# Patient Record
Sex: Male | Born: 1995 | Race: White | Hispanic: No | Marital: Single
Health system: Southern US, Community
[De-identification: ages and names within clinical notes are randomized; demographics above are authoritative.]

## PROBLEM LIST (undated history)

## (undated) HISTORY — PX: ANKLE SURGERY: SHX546

---
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
## 2017-02-06 ENCOUNTER — Other Ambulatory Visit: Payer: Self-pay

## 2017-02-06 ENCOUNTER — Ambulatory Visit (INDEPENDENT_AMBULATORY_CARE_PROVIDER_SITE_OTHER): Payer: 59

## 2017-02-06 ENCOUNTER — Ambulatory Visit (HOSPITAL_COMMUNITY)
Admission: EM | Admit: 2017-02-06 | Discharge: 2017-02-06 | Disposition: A | Discharge status: Home / Self Care | Payer: 59 | Attending: Family Medicine | Admitting: Family Medicine

## 2017-02-06 ENCOUNTER — Encounter (HOSPITAL_COMMUNITY): Payer: Self-pay | Admitting: Emergency Medicine

## 2017-02-06 DIAGNOSIS — R05 Cough: Secondary | ICD-10-CM

## 2017-02-06 DIAGNOSIS — R0789 Other chest pain: Secondary | ICD-10-CM | POA: Diagnosis not present

## 2017-02-06 DIAGNOSIS — R059 Cough, unspecified: Secondary | ICD-10-CM

## 2017-02-06 MED ORDER — AZITHROMYCIN 250 MG PO TABS: 250.0000 mg | ORAL_TABLET | Freq: Every day | ORAL | 0 refills | Status: AC

## 2017-02-06 NOTE — ED Provider Notes (Signed)
  [facility]   [id number] 02/06/17 Arrival Time: 1009  ASSESSMENT & PLAN:  1. Cough   -persistent  Meds ordered this encounter  Medications  . azithromycin (ZITHROMAX) 250 MG tablet    Sig: Take 1 tablet (250 mg total) by mouth daily. Take first 2 tablets together, then 1 every day until finished.    Dispense:  6 tablet    Refill:  0   OTC symptom care as needed. May f/u with PCP or here as needed.  Reviewed expectations re: course of current medical issues. Questions answered. Outlined signs and symptoms indicating need for more acute intervention. Patient verbalized understanding. After Visit Summary given.   SUBJECTIVE: History from: patient and caregiver.  Wyatt Ryan is a 22 y.o. male who presents with complaint of an intermittent dry cough. Onset abrupt, approximately approx 2 months ago. SOB: none. Wheezing: none. Fever: no. Overall normal PO intake without n/v. Sick contacts: no. OTC treatment: none. [employer] student. Otherwise feeling well. For the past 4-5 days he reports lower R anterior chest discomfort, esp when coughing or taking a deep breath. No injury.  Social History   Tobacco Use  Smoking Status Not on file    ROS: As per HPI.   OBJECTIVE:  Vitals:   02/06/17 1102  BP: 127/66  Pulse: 77  Resp: 18  Temp: 98.3 F (36.8 C)  TempSrc: Oral  SpO2: 98%    General appearance: alert HEENT: mild nasal congestion; throat normal Neck: supple without LAD Lungs: unlabored respirations, symmetrical air entry; cough: mild; no respiratory distress Skin: warm and dry Psychological: alert and cooperative; normal mood and affect  Imaging: Dg Chest 2 View  Result Date: 02/06/2017 CLINICAL DATA:  URI symptoms since before Thanksgiving, intermittently productive cough, sharp ribcage discomfort EXAM: CHEST  2 VIEW COMPARISON:  None in PACs FINDINGS: The lungs are well-expanded and clear. The heart and pulmonary vascularity are normal. The  mediastinum is normal in width. There is no pleural effusion. The bony thorax exhibits no acute abnormality. IMPRESSION: There is no pneumonia nor other acute cardiopulmonary abnormality. Electronically Signed   By: David  SwazilandJordan M.D.   On: 02/06/2017 11:48    No Known Allergies  Social History   Socioeconomic History  . Marital status: Single    Spouse name: Not on file  . Number of children: Not on file  . Years of education: Not on file  . Highest education level: Not on file  Social Needs  . Financial resource strain: Not on file  . Food insecurity - worry: Not on file  . Food insecurity - inability: Not on file  . Transportation needs - medical: Not on file  . Transportation needs - non-medical: Not on file  Occupational History  . Not on file  Tobacco Use  . Smoking status: Not on file  Substance and Sexual Activity  . Alcohol use: Not on file  . Drug use: Not on file  . Sexual activity: Not on file  Other Topics Concern  . Not on file  Social History Narrative  . Not on file           Mardella LaymanHagler, Roanne Haye, MD 02/06/17 1204

## 2017-02-06 NOTE — ED Triage Notes (Signed)
Pt c/o Right lower rib pain x5 days, has had a cold symptoms since before thanksgiving. Pt c/o coughing.

## 2019-04-26 IMAGING — DX DG CHEST 2V
2 series · 2 of 2 positions shown · non-contrast
Comparison: None in PACs

CLINICAL DATA: URI symptoms since before Thanksgiving,
intermittently productive cough, sharp ribcage discomfort

EXAM:
CHEST  2 VIEW

[chest pa]
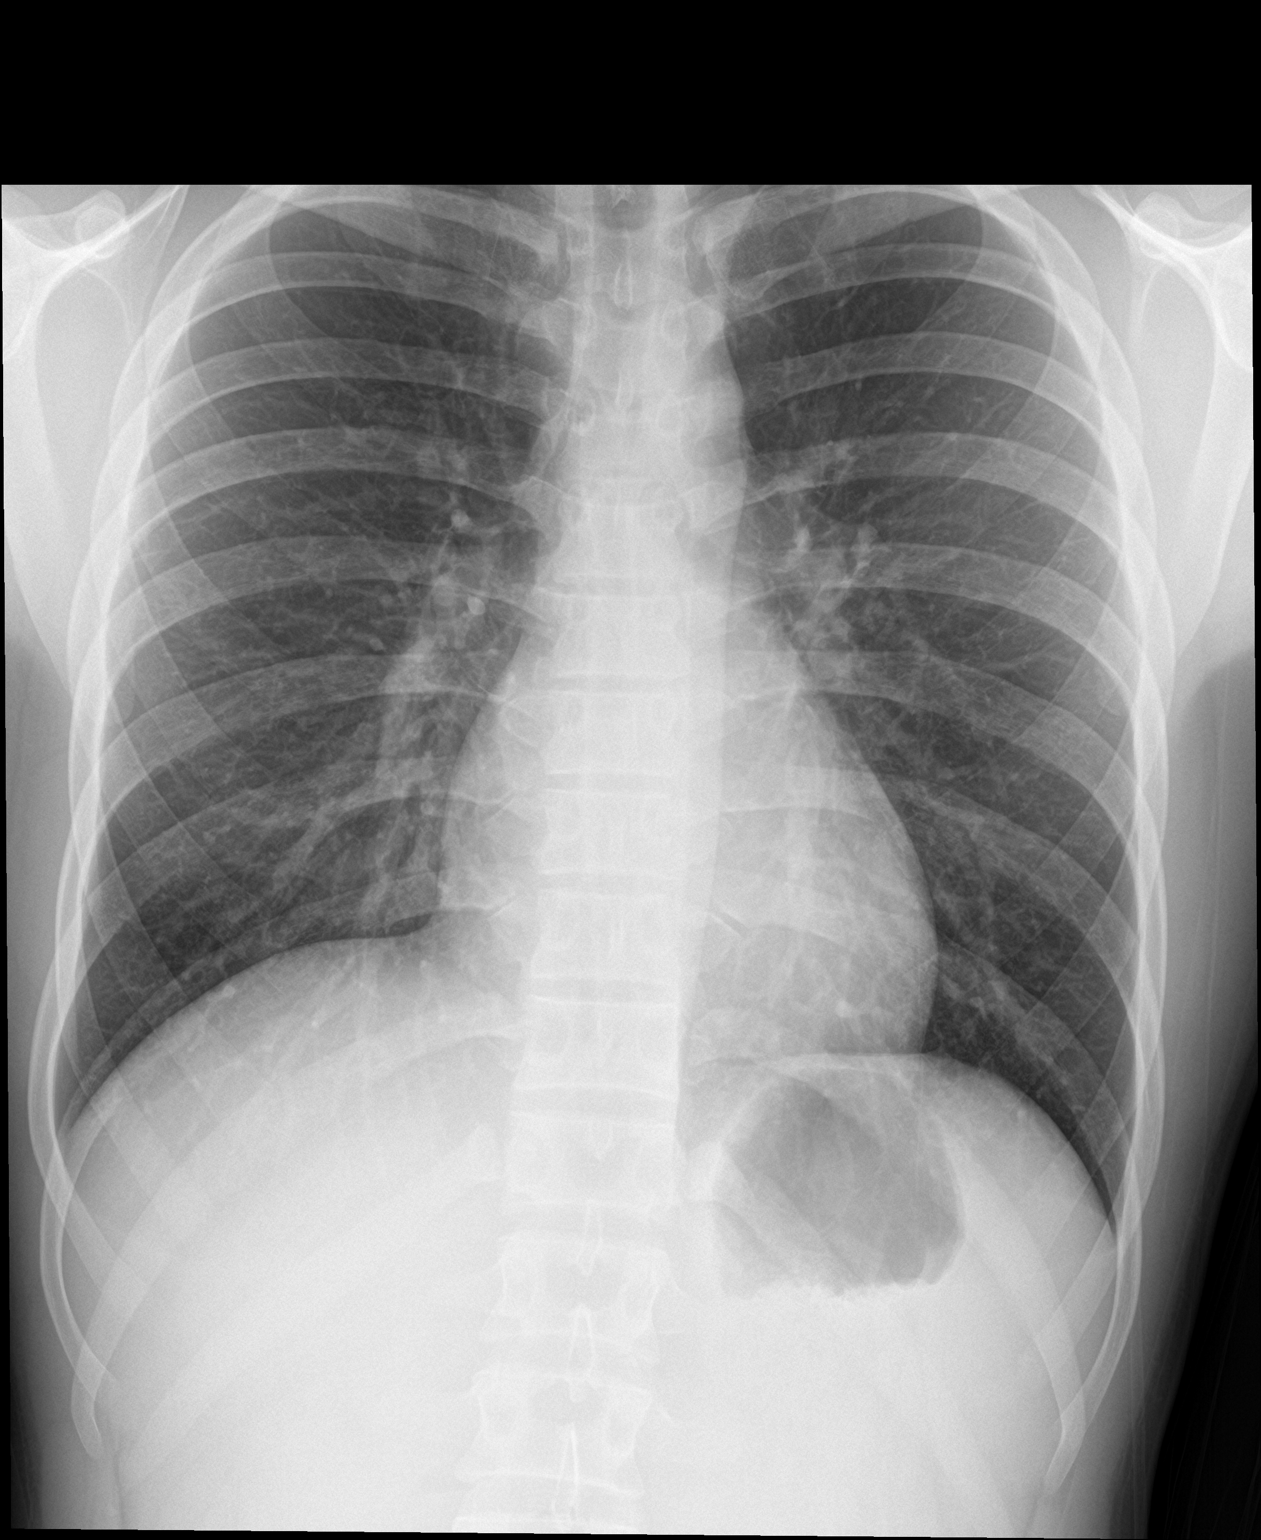

[chest lat]
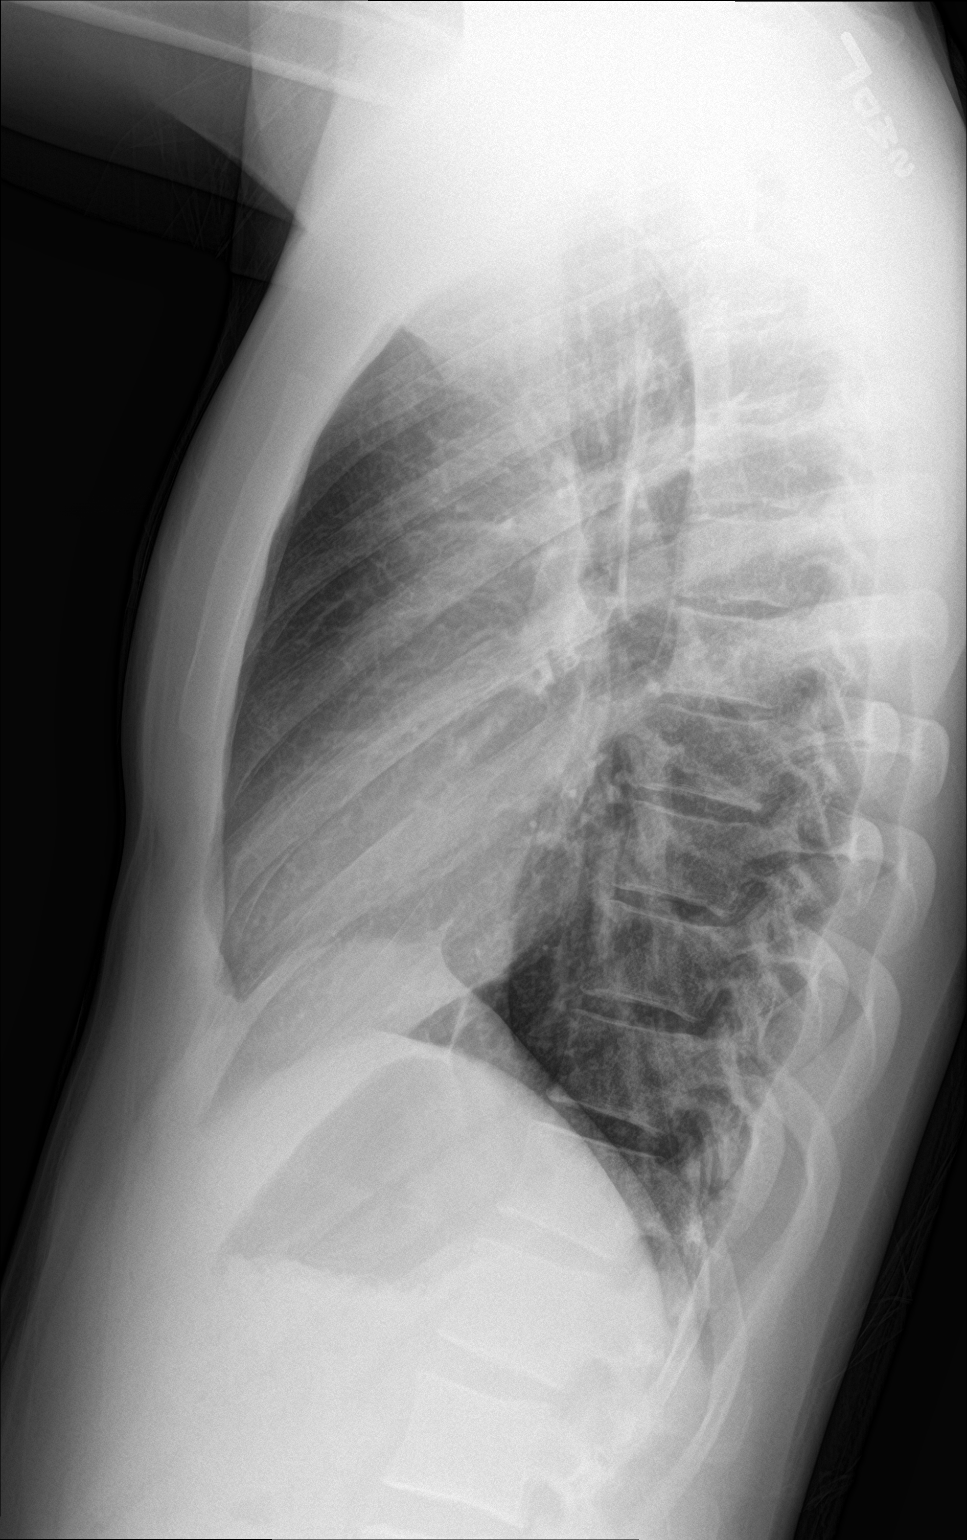

[2 of 2 positions shown; findings below may reference images not displayed]

FINDINGS: The lungs are well-expanded and clear. The heart and pulmonary
vascularity are normal. The mediastinum is normal in width. There is
no pleural effusion. The bony thorax exhibits no acute abnormality.
IMPRESSION: There is no pneumonia nor other acute cardiopulmonary abnormality.
